# Patient Record
Sex: Male | Born: 1980 | Race: Black or African American | Hispanic: No | Marital: Single | Smoking: Current every day smoker
Health system: Southern US, Community
[De-identification: ages and names within clinical notes are randomized; demographics above are authoritative.]

## PROBLEM LIST (undated history)

## (undated) HISTORY — PX: FACIAL FRACTURE SURGERY: SHX1570

---

## 2017-09-19 ENCOUNTER — Emergency Department (HOSPITAL_COMMUNITY): Payer: Self-pay

## 2017-09-19 ENCOUNTER — Other Ambulatory Visit: Payer: Self-pay

## 2017-09-19 ENCOUNTER — Emergency Department (HOSPITAL_COMMUNITY)
Admission: EM | Admit: 2017-09-19 | Discharge: 2017-09-19 | Disposition: A | Discharge status: Home / Self Care | Payer: Self-pay | Attending: Emergency Medicine | Admitting: Emergency Medicine

## 2017-09-19 DIAGNOSIS — Y929 Unspecified place or not applicable: Secondary | ICD-10-CM | POA: Insufficient documentation

## 2017-09-19 DIAGNOSIS — W500XXA Accidental hit or strike by another person, initial encounter: Secondary | ICD-10-CM | POA: Insufficient documentation

## 2017-09-19 DIAGNOSIS — Z23 Encounter for immunization: Secondary | ICD-10-CM | POA: Insufficient documentation

## 2017-09-19 DIAGNOSIS — S6992XA Unspecified injury of left wrist, hand and finger(s), initial encounter: Secondary | ICD-10-CM | POA: Insufficient documentation

## 2017-09-19 DIAGNOSIS — Y939 Activity, unspecified: Secondary | ICD-10-CM | POA: Insufficient documentation

## 2017-09-19 DIAGNOSIS — Y998 Other external cause status: Secondary | ICD-10-CM | POA: Insufficient documentation

## 2017-09-19 MED ORDER — SULFAMETHOXAZOLE-TRIMETHOPRIM 800-160 MG PO TABS: 1.0000 | ORAL_TABLET | Freq: Two times a day (BID) | ORAL | 0 refills | Status: AC

## 2017-09-19 MED ORDER — TETANUS-DIPHTH-ACELL PERTUSSIS 5-2.5-18.5 LF-MCG/0.5 IM SUSP
0.5000 mL | Freq: Once | INTRAMUSCULAR | Status: AC
  Administered 2017-09-19: 0.5 mL via INTRAMUSCULAR
  Filled 2017-09-19: qty 0.5

## 2017-09-19 MED ORDER — CLINDAMYCIN HCL 150 MG PO CAPS: 450.0000 mg | ORAL_CAPSULE | Freq: Three times a day (TID) | ORAL | 0 refills | Status: AC

## 2017-09-19 MED ORDER — ACETAMINOPHEN 325 MG PO TABS
650.0000 mg | ORAL_TABLET | Freq: Once | ORAL | Status: AC
  Administered 2017-09-19: 650 mg via ORAL
  Filled 2017-09-19: qty 2

## 2017-09-19 MED ORDER — BACITRACIN ZINC 500 UNIT/GM EX OINT
TOPICAL_OINTMENT | Freq: Two times a day (BID) | CUTANEOUS | Status: DC
  Administered 2017-09-19: 1 via TOPICAL
  Filled 2017-09-19: qty 0.9

## 2017-09-19 NOTE — Discharge Instructions (Addendum)
You were given a prescription for antibiotics. Please take the antibiotic prescription fully.   You need to call the hand surgery office and make an appointment for follow-up for reevaluation.  Please return to the emergency room immediately if you experience any new or worsening symptoms or any symptoms that indicate worsening infection such as fevers, increased redness/swelling/pain, warmth, or drainage from the affected area.
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
## 2018-02-26 ENCOUNTER — Ambulatory Visit (HOSPITAL_COMMUNITY)
Admission: EM | Admit: 2018-02-26 | Discharge: 2018-02-26 | Disposition: A | Discharge status: Home / Self Care | Payer: Self-pay | Attending: Family Medicine | Admitting: Family Medicine

## 2018-02-26 ENCOUNTER — Encounter (HOSPITAL_COMMUNITY): Payer: Self-pay | Admitting: Emergency Medicine

## 2018-02-26 ENCOUNTER — Other Ambulatory Visit: Payer: Self-pay

## 2018-02-26 DIAGNOSIS — Z202 Contact with and (suspected) exposure to infections with a predominantly sexual mode of transmission: Secondary | ICD-10-CM | POA: Insufficient documentation

## 2018-02-26 DIAGNOSIS — Z113 Encounter for screening for infections with a predominantly sexual mode of transmission: Secondary | ICD-10-CM

## 2018-02-26 MED ORDER — METRONIDAZOLE 500 MG PO TABS
ORAL_TABLET | ORAL | Status: AC
  Filled 2018-02-26: qty 4

## 2018-02-26 MED ORDER — METRONIDAZOLE 500 MG PO TABS
2000.0000 mg | ORAL_TABLET | Freq: Once | ORAL | Status: AC
  Administered 2018-02-26: 2000 mg via ORAL

## 2018-02-26 NOTE — Discharge Instructions (Signed)
We have treated you today for trichomonas, with metronidazole. Please refrain from sexual activity for 7 days while medicine is clearing infection.  We are testing you for Gonorrhea, Chlamydia and Trichomonas. We will call you if anything is positive and let you know if you require any further treatment. Please inform partner of any positive results.  Please return if symptoms not improving with treatment, development of fever, nausea, vomiting, abdominal pain, scrotal pain.  
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
## 2018-02-26 NOTE — ED Triage Notes (Signed)
Denies penile discharge, girlfriend has discharge.

## 2018-02-27 LAB — URINE CYTOLOGY ANCILLARY ONLY
Chlamydia: NEGATIVE
NEISSERIA GONORRHEA: NEGATIVE
TRICH (WINDOWPATH): NEGATIVE

## 2018-03-01 ENCOUNTER — Telehealth (HOSPITAL_COMMUNITY): Payer: Self-pay

## 2018-10-03 ENCOUNTER — Encounter (HOSPITAL_COMMUNITY): Payer: Self-pay

## 2018-10-03 ENCOUNTER — Other Ambulatory Visit: Payer: Self-pay

## 2018-10-03 ENCOUNTER — Ambulatory Visit (HOSPITAL_COMMUNITY)
Admission: EM | Admit: 2018-10-03 | Discharge: 2018-10-03 | Disposition: A | Discharge status: Home / Self Care | Payer: Self-pay | Attending: Internal Medicine | Admitting: Internal Medicine

## 2018-10-03 DIAGNOSIS — N5089 Other specified disorders of the male genital organs: Secondary | ICD-10-CM

## 2018-10-03 DIAGNOSIS — N50811 Right testicular pain: Secondary | ICD-10-CM

## 2018-10-03 LAB — POCT URINALYSIS DIP (DEVICE)
Glucose, UA: NEGATIVE mg/dL
Hgb urine dipstick: NEGATIVE
Ketones, ur: NEGATIVE mg/dL
Nitrite: POSITIVE — AB
Protein, ur: 100 mg/dL — AB
Specific Gravity, Urine: 1.025 (ref 1.005–1.030)
Urobilinogen, UA: 2 mg/dL — ABNORMAL HIGH (ref 0.0–1.0)
pH: 7 (ref 5.0–8.0)
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
## 2018-10-04 ENCOUNTER — Emergency Department (HOSPITAL_COMMUNITY): Payer: Self-pay

## 2018-10-04 ENCOUNTER — Other Ambulatory Visit: Payer: Self-pay

## 2018-10-04 ENCOUNTER — Emergency Department (HOSPITAL_COMMUNITY)
Admission: EM | Admit: 2018-10-04 | Discharge: 2018-10-04 | Disposition: A | Discharge status: Home / Self Care | Payer: Self-pay | Attending: Emergency Medicine | Admitting: Emergency Medicine

## 2018-10-04 ENCOUNTER — Encounter (HOSPITAL_COMMUNITY): Payer: Self-pay

## 2018-10-04 DIAGNOSIS — N451 Epididymitis: Secondary | ICD-10-CM | POA: Insufficient documentation

## 2018-10-04 DIAGNOSIS — F17290 Nicotine dependence, other tobacco product, uncomplicated: Secondary | ICD-10-CM | POA: Insufficient documentation

## 2018-10-04 LAB — URINALYSIS, ROUTINE W REFLEX MICROSCOPIC
Bilirubin Urine: NEGATIVE
Glucose, UA: NEGATIVE mg/dL
Hgb urine dipstick: NEGATIVE
Ketones, ur: 5 mg/dL — AB
Nitrite: NEGATIVE
Protein, ur: 30 mg/dL — AB
Specific Gravity, Urine: 1.018 (ref 1.005–1.030)
pH: 5 (ref 5.0–8.0)

## 2018-10-04 LAB — CYTOLOGY, (ORAL, ANAL, URETHRAL) ANCILLARY ONLY
Chlamydia: NEGATIVE
Neisseria Gonorrhea: NEGATIVE
Trichomonas: NEGATIVE

## 2018-10-04 MED ORDER — DOXYCYCLINE HYCLATE 100 MG PO CAPS: 100.0000 mg | ORAL_CAPSULE | Freq: Two times a day (BID) | ORAL | 0 refills | Status: AC

## 2018-10-04 MED ORDER — LIDOCAINE HCL (PF) 1 % IJ SOLN
1.0000 mL | Freq: Once | INTRAMUSCULAR | Status: AC
  Administered 2018-10-04: 1 mL
  Filled 2018-10-04: qty 30

## 2018-10-04 MED ORDER — CEFTRIAXONE SODIUM 250 MG IJ SOLR
250.0000 mg | Freq: Once | INTRAMUSCULAR | Status: AC
  Administered 2018-10-04: 250 mg via INTRAMUSCULAR
  Filled 2018-10-04: qty 250

## 2018-10-04 NOTE — ED Notes (Signed)
Ultrasound at bedside

## 2018-10-04 NOTE — ED Notes (Signed)
Urine culture sent to lab with urine sample.  

## 2018-10-04 NOTE — ED Notes (Signed)
Patient choose not to wait at the hospital until 40 mins. post IM injection.
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
## 2018-10-04 NOTE — ED Triage Notes (Signed)
Patient c/o right testicle swelling x 3 days. Patient denies any penile discharge or problems urinating.

## 2018-10-05 LAB — RPR: RPR Ser Ql: NONREACTIVE

## 2018-10-05 LAB — HIV ANTIBODY (ROUTINE TESTING W REFLEX): HIV Screen 4th Generation wRfx: NONREACTIVE
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
## 2021-03-06 IMAGING — US US SCROTUM W/ DOPPLER COMPLETE
1 series · 14 of 25 positions shown · non-contrast
Comparison: None.

CLINICAL DATA: Right testicular pain for 3 days.

EXAM:
SCROTAL ULTRASOUND
DOPPLER ULTRASOUND OF THE TESTICLES
TECHNIQUE: Complete ultrasound examination of the testicles, epididymis, and
other scrotal structures was performed. Color and spectral Doppler
ultrasound were also utilized to evaluate blood flow to the
testicles.

[Series 1: us scrotum w/ doppler complete · 14 of 70 slices shown]
[im 1/70]
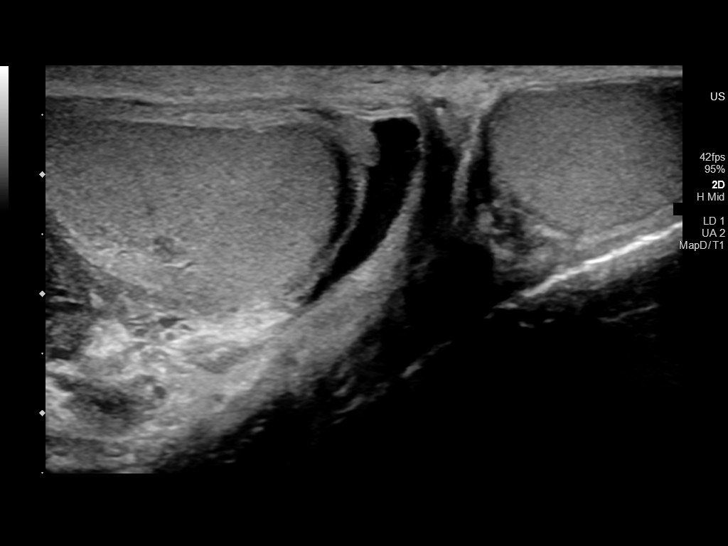
[im 6/70]
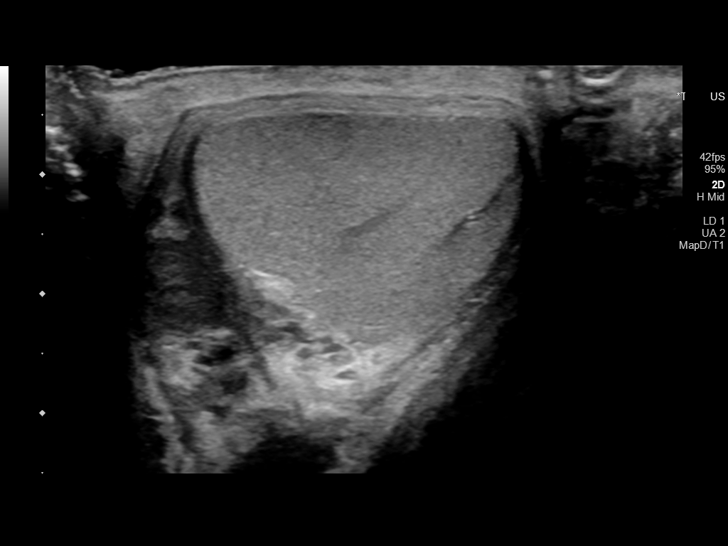
[im 12/70]
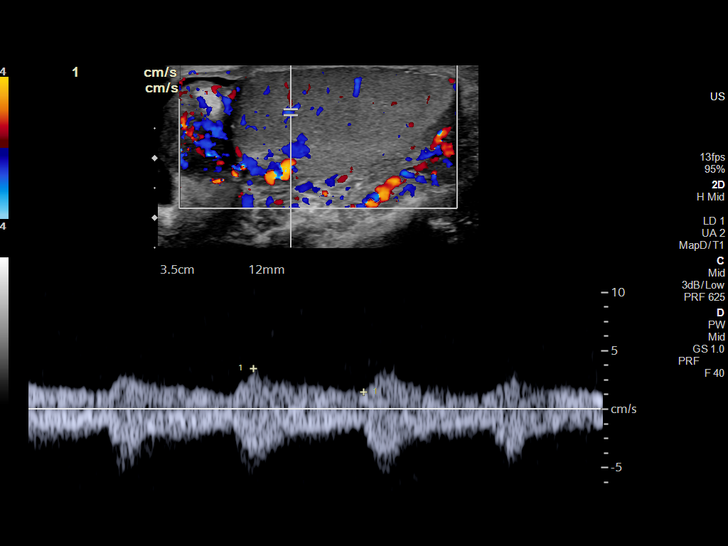
[im 18/70]
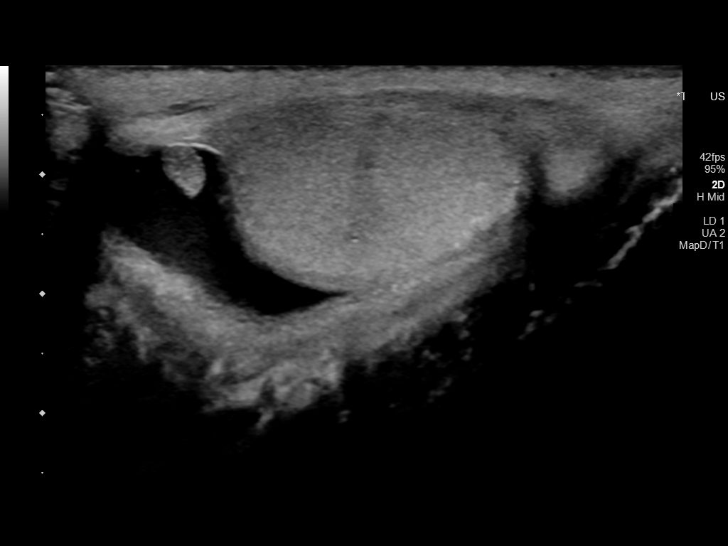
[im 24/70]
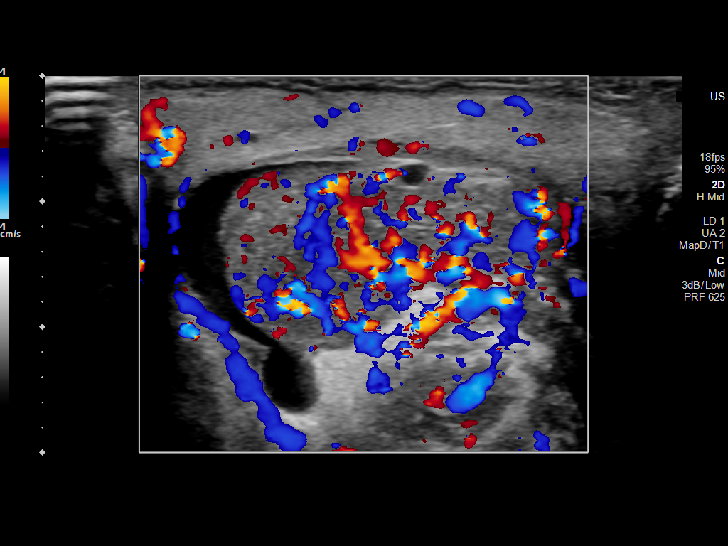
[im 26/70]
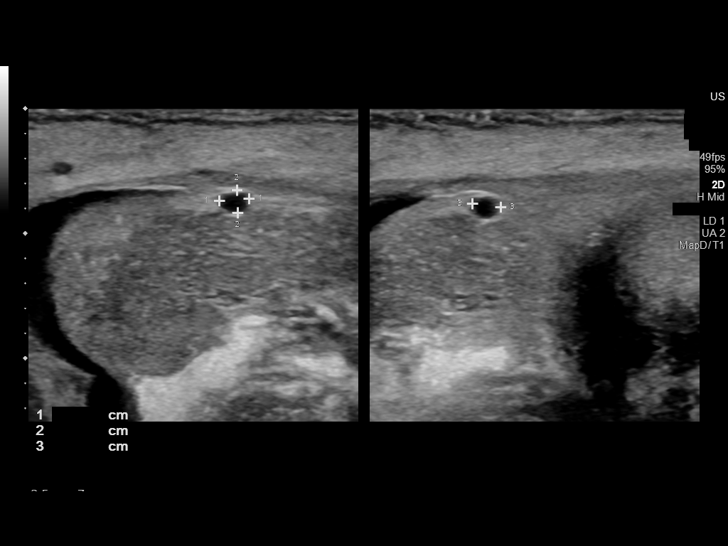
[im 32/70]
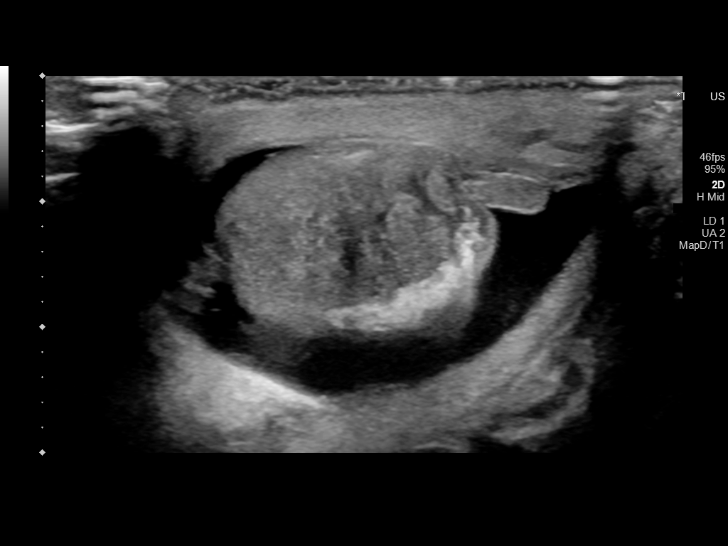
[im 38/70]
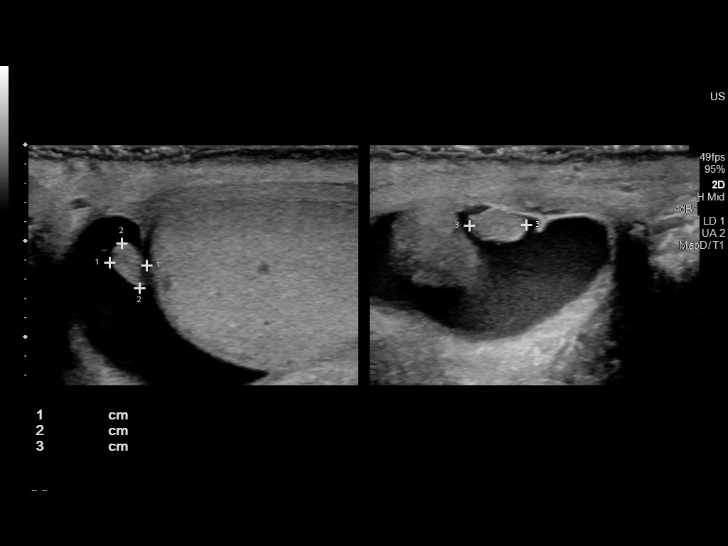
[im 44/70]
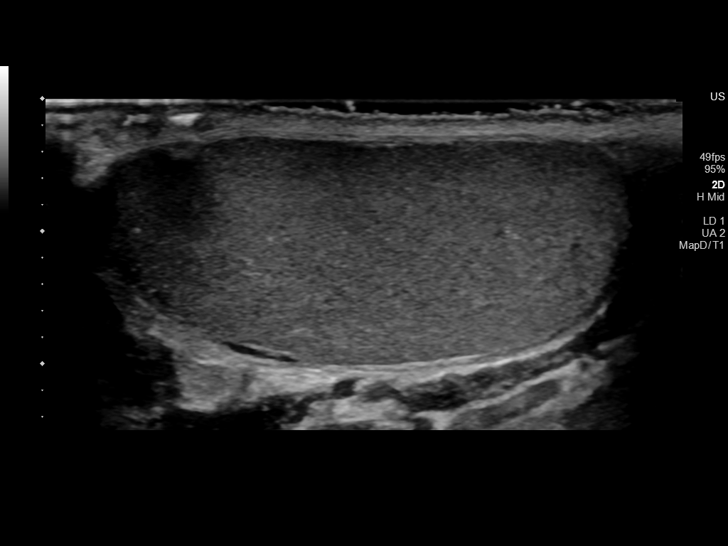
[im 47/70]
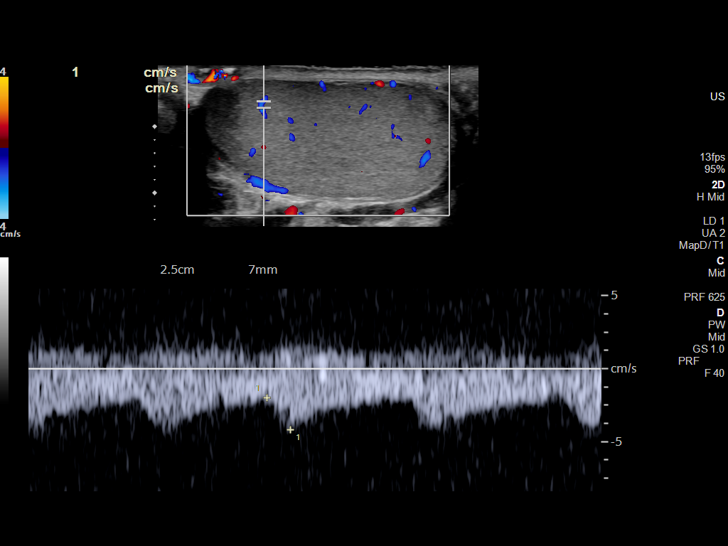
[im 52/70]
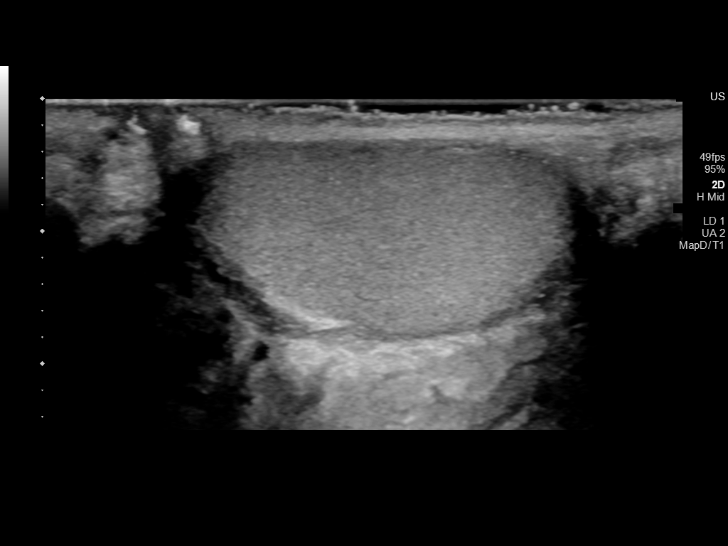
[im 58/70]
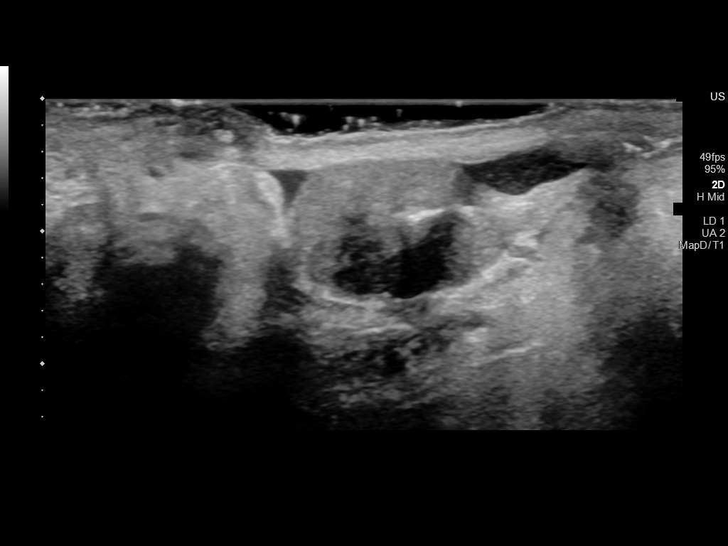
[im 64/70]
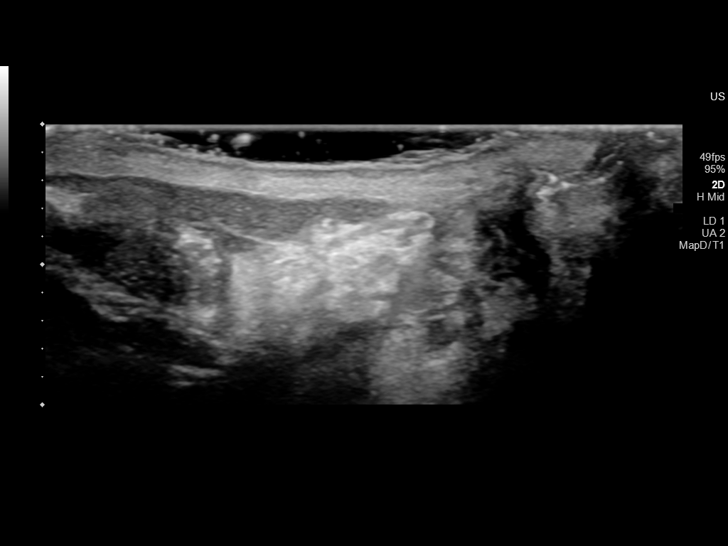
[im 70/70]
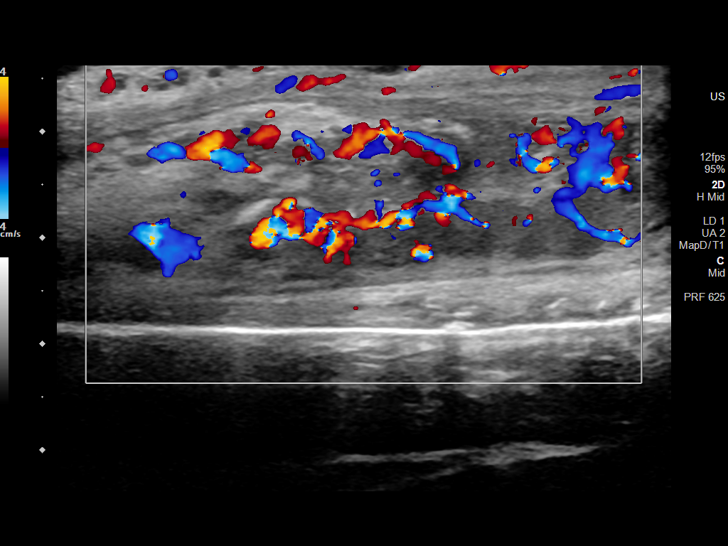

[14 of 25 positions shown; findings below may reference images not displayed]

FINDINGS: Right testicle

Measurements: 4.0 x 2.0 x 2.8 cm. No mass or microlithiasis
visualized.

Left testicle

Measurements: 4.0 x 1.7 x 2.5 cm. No mass or microlithiasis
visualized.

Right epididymis: The epididymis is enlarged with marked increased
vascularity. 2 mm cyst in the epididymal head.

Left epididymis:  Normal in size and appearance.

Hydrocele:  Small right hydrocele.

Varicocele:  None visualized.

Pulsed Doppler interrogation of both testes demonstrates normal low
resistance arterial and venous waveforms bilaterally.
IMPRESSION: 1. Right epididymitis.
2. Normal appearing testicles.
